# Patient Record
Sex: Female | Born: 1960 | Race: White | Hispanic: No | Marital: Married | Smoking: Never smoker
Health system: Southern US, Community
[De-identification: ages and names within clinical notes are randomized; demographics above are authoritative.]

---

## 2020-01-18 ENCOUNTER — Emergency Department (HOSPITAL_BASED_OUTPATIENT_CLINIC_OR_DEPARTMENT_OTHER)
Admission: EM | Admit: 2020-01-18 | Discharge: 2020-01-18 | Disposition: A | Discharge status: Home / Self Care | Payer: BC Managed Care – PPO | Attending: Emergency Medicine | Admitting: Emergency Medicine

## 2020-01-18 ENCOUNTER — Encounter (HOSPITAL_BASED_OUTPATIENT_CLINIC_OR_DEPARTMENT_OTHER): Payer: Self-pay | Admitting: *Deleted

## 2020-01-18 ENCOUNTER — Other Ambulatory Visit: Payer: Self-pay

## 2020-01-18 ENCOUNTER — Emergency Department (HOSPITAL_BASED_OUTPATIENT_CLINIC_OR_DEPARTMENT_OTHER): Payer: BC Managed Care – PPO

## 2020-01-18 DIAGNOSIS — M7918 Myalgia, other site: Secondary | ICD-10-CM | POA: Diagnosis not present

## 2020-01-18 DIAGNOSIS — Y939 Activity, unspecified: Secondary | ICD-10-CM | POA: Diagnosis not present

## 2020-01-18 DIAGNOSIS — R072 Precordial pain: Secondary | ICD-10-CM | POA: Insufficient documentation

## 2020-01-18 DIAGNOSIS — Y929 Unspecified place or not applicable: Secondary | ICD-10-CM | POA: Insufficient documentation

## 2020-01-18 DIAGNOSIS — Y999 Unspecified external cause status: Secondary | ICD-10-CM | POA: Insufficient documentation

## 2020-01-18 MED ORDER — ACETAMINOPHEN 325 MG PO TABS
650.0000 mg | ORAL_TABLET | Freq: Once | ORAL | Status: AC
  Administered 2020-01-18: 650 mg via ORAL
  Filled 2020-01-18: qty 2
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
## 2020-02-25 ENCOUNTER — Inpatient Hospital Stay: Payer: BC Managed Care – PPO | Admitting: Family Medicine
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
